# Patient Record
Sex: Female | Born: 1990
Health system: Southern US, Community
[De-identification: ages and names within clinical notes are randomized; demographics above are authoritative.]

---
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
## 2011-12-08 ENCOUNTER — Ambulatory Visit (INDEPENDENT_AMBULATORY_CARE_PROVIDER_SITE_OTHER): Payer: Self-pay | Admitting: Family Medicine

## 2011-12-08 ENCOUNTER — Encounter: Payer: Self-pay | Admitting: Family Medicine

## 2011-12-08 VITALS — BP 114/76 | HR 71 | Temp 97.7°F | Ht 66.0 in | Wt 141.0 lb

## 2011-12-08 DIAGNOSIS — R109 Unspecified abdominal pain: Secondary | ICD-10-CM

## 2011-12-08 LAB — COMPREHENSIVE METABOLIC PANEL
CO2: 27 mEq/L (ref 19–32)
Calcium: 9.9 mg/dL (ref 8.4–10.5)
Creat: 0.7 mg/dL (ref 0.50–1.10)
Glucose, Bld: 66 mg/dL — ABNORMAL LOW (ref 70–99)
Sodium: 137 mEq/L (ref 135–145)
Total Bilirubin: 0.3 mg/dL (ref 0.3–1.2)
Total Protein: 7.6 g/dL (ref 6.0–8.3)

## 2011-12-08 LAB — POCT URINALYSIS DIPSTICK
Ketones, UA: NEGATIVE
Protein, UA: NEGATIVE
Spec Grav, UA: 1.025
pH, UA: 5.5

## 2011-12-08 LAB — LIPASE: Lipase: 17 U/L (ref 0–75)

## 2011-12-08 NOTE — Patient Instructions (Addendum)
Hannah Lopez,  Thank you for coming in today. Please keep in mind the things we discussed, eating healthy to prevent stomach upset and pain.  I will call or send a letter with your blood work results.  I would like to check a fasting cholesterol in the future.   Dr. Armen Pickup

## 2011-12-10 ENCOUNTER — Encounter: Payer: Self-pay | Admitting: Family Medicine

## 2011-12-10 NOTE — Assessment & Plan Note (Addendum)
A: intermittent abdominal pain associated with eating certain foods. Normal exam today. Biliary dysfunction is a possibility but the pain is not in the normal location for biliary colic. May also be gas pain.  P: -check labs: CMP, lipase, UA -advised avoidance of provocative foods.

## 2011-12-10 NOTE — Progress Notes (Signed)
Subjective:     Patient ID: Marabella Popiel, female   DOB: 09/11/1990, 21 y.o.   MRN: [id number]  HPI 21 yo F presents to establish primary care and discuss the following: 1. LLQ pain: non-radiating, achy, no pain currently, associated with eating fatty foods and dizziness. No fever,  N/V/D, blood in stool. No sexual activity.   No past medical history on file. No past surgical history on file. Family History  Problem Relation Age of Onset  . Diabetes Mother   . Hyperlipidemia Mother   . Diabetes Father    History   Social History  . Marital Status: Single    Spouse Name: N/A    Number of Children: N/A  . Years of Education: 12    Occupational History  . Student      [employer]   Social History Main Topics  . Smoking status: Current Some Day Smoker  . Smokeless tobacco: Not on file  . Alcohol Use: 6.6 oz/week    2 Cans of beer, 9 Shots of liquor per week  . Drug Use: No  . Sexually Active: No   Other Topics Concern  . Not on file   Social History Narrative   Lives at home with mom, dad and 4 younger brothers.    Review of Systems Negative except as per HPI.     Objective:   Physical Exam BP 114/76  Pulse 71  Temp 97.7 F (36.5 C) (Oral)  Ht 5\' 6"  (1.676 m)  Wt 141 lb (63.957 kg)  BMI 22.76 kg/m2  LMP 11/12/2011  General Appearance:    Alert, cooperative, no distress, appears stated age  Head:    Normocephalic, without obvious abnormality, atraumatic  Eyes:    PERRL, conjunctiva/corneas clear, EOM's intact, fundi    benign, both eyes  Ears:    Normal TM's and external ear canals, both ears  Nose:   Nares normal, septum midline, mucosa normal, no drainage    or sinus tenderness  Throat:   Lips, mucosa, and tongue normal; teeth and gums normal  Neck:   Supple, symmetrical, trachea midline, no adenopathy;    thyroid:  no enlargement/tenderness/nodules; no carotid   bruit or JVD  Back:     Symmetric, no curvature, ROM normal, no CVA tenderness    Lungs:     Clear to auscultation bilaterally, respirations unlabored  Chest Wall:    No tenderness or deformity   Heart:    Regular rate and rhythm, S1 and S2 normal, no murmur, rub   or gallop  Abdomen:     Soft, non-tender, bowel sounds active all four quadrants,    no masses, no organomegaly  Extremities:   Extremities normal, atraumatic, no cyanosis or edema  Pulses:   2+ and symmetric all extremities  Skin:   Skin color, texture, turgor normal, no rashes or lesions  Lymph nodes:   Cervical, supraclavicular, and axillary nodes normal  Neurologic:   Grossly normal.    Assessment and Plan:

## 2012-01-11 ENCOUNTER — Ambulatory Visit (INDEPENDENT_AMBULATORY_CARE_PROVIDER_SITE_OTHER): Payer: Self-pay | Admitting: Family Medicine

## 2012-01-11 ENCOUNTER — Encounter: Payer: Self-pay | Admitting: Family Medicine

## 2012-01-11 VITALS — BP 125/84 | HR 72 | Temp 97.9°F | Ht 66.0 in | Wt 139.0 lb

## 2012-01-11 DIAGNOSIS — N6009 Solitary cyst of unspecified breast: Secondary | ICD-10-CM

## 2012-01-11 NOTE — Assessment & Plan Note (Signed)
Reassured patient that this is likely a benign fibrocystic change.  Provided patient education on fibrocystic breast changes, advised to keep an eye on it, f/u if it changes.

## 2012-01-11 NOTE — Patient Instructions (Signed)
Breast Cyst A breast cyst is a sac in your breast that is filled with fluid. This is common in women. Women can have one or many cysts. When the breasts contain many cysts, it is usually due to a noncancerous (benign) condition called fibrocystic change. These lumps form under the influence of female hormones (estrogen and progesterone). The lumps are most often located in the upper, outer portion of the breast. They are often more swollen, painful, and tender before your period starts. They usually disappear after menopause, unless you are on hormone therapy. Different types of cysts:  Macrocysts. These are cysts that are about 2 inches (5.1 cm) in diameter.   Microcysts. These are tiny cysts that you cannot feel, but that are seen with a mammogram or an ultrasound.   Galactocele. This is a cyst containing milk, which develops when and if you suddenly stop breastfeeding.   Sebaceous cyst of the skin (not in the breast tissue itself). These are not cancerous.  Breast cysts do not increase your chance of getting breast cancer. However, they must be followed and treated closely, because a cyst can be cancerous. Be sure to see your caregiver for follow-up care as recommended.  CAUSES   It is not completely known what causes a breast cyst.   Estrogen may influence the development of a breast cyst.   An overgrowth of milk glands and connective tissue in the breast can block the milk glands, causing them to fill with fluid.   Scar tissue in the breast from previous surgery may block the glands, causing a cyst.  SYMPTOMS   Feeling a smooth, round, soft lump (like a grape) in the breast that is easily moveable.   Breast discomfort or pain, especially in the area of the cyst.   Increase in size of the lump before your menstrual period, and decrease in size after your menstrual period.  DIAGNOSIS   The cyst can be felt during an exam by your caregiver.   Mammogram (breast X-ray).    Ultrasound.   Removing fluid from the cyst with a needle (fine needle aspiration).  TREATMENT   Your caregiver may feel there is no reason for treatment. He or she may watch to see if it goes away on its own.   Hormone treatment.   Needle aspiration. There is a 40% chance of the cyst recurring after aspiration.   Surgery to remove the whole cyst.  HOME CARE INSTRUCTIONS   Get a yearly exam by your caregiver.   Practice "breast self-awareness." This means understanding the normal appearance and feel of your breasts and may include breast self-examination.   Have a clinical breast exam (CBE) by a caregiver every 1 to 3 years if you are 79 to 20 years of age. After age 42, you should have a CBE every year.   Get mammogram tests as directed by your caregiver.   Only take over-the-counter pain medicine as directed by your caregiver.   Wear a good support bra, especially when exercising.   Avoid caffeine.   Reduce your salt intake, especially before your menstrual period. Too much salt can cause fluid retention, breast swelling, and discomfort.  SEEK MEDICAL CARE IF:   You feel, or think you feel, a lump in your breast.   You notice that both breasts look different than usual.   You notice that both breasts feel different than before.   Your breast is still causing pain, after your menstrual period is over.  You need medicine for breast pain and swelling that occurs with your menstrual period.  SEEK IMMEDIATE MEDICAL CARE IF:   You develop severe pain, tenderness, redness, or warmth in your breast.   You develop nipple discharge or bleeding.   Your breast lump becomes hard and painful.   You find new lumps or bumps that were not there before.   You feel lumps in your armpit (axilla).   You notice dimpling or wrinkling of the breast or nipple.   You have a fever.  Document Released: 05/18/2005 Document Revised: 05/07/2011 Document Reviewed: 09/07/2008 [facility]  Patient Information 2012 [place], [place].

## 2012-01-11 NOTE — Progress Notes (Signed)
  Subjective:    Patient ID: Hannah Lopez, female    DOB: August 07, 1990, 21 y.o.   MRN: [id number]  HPI  Hannah Lopez comes in complaining of a knot she can feel in her left upper outer breast.  She noticed it a few weeks ago.  She has never noticed one before.  It does not hurt, it has not changed in size.    Family History Negative for Breast cancer.   Review of Systems See HPI    Objective:   Physical Exam BP 125/84  Pulse 72  Temp 97.9 F (36.6 C) (Oral)  Ht 5\' 6"  (1.676 m)  Wt 139 lb (63.05 kg)  BMI 22.44 kg/m2  LMP 12/21/2011 General appearance: alert, cooperative and no distress Breasts: normal appearance, no masses or tenderness, Patient has a <1 cm mobile fibrocystic mass in left upper outer quadrant.  Bilateral nipple piercings.        Assessment & Plan:

## 2012-03-09 ENCOUNTER — Emergency Department (HOSPITAL_COMMUNITY)
Admission: EM | Admit: 2012-03-09 | Discharge: 2012-03-09 | Disposition: A | Discharge status: Home / Self Care | Payer: No Typology Code available for payment source | Attending: Emergency Medicine | Admitting: Emergency Medicine

## 2012-03-09 ENCOUNTER — Encounter (HOSPITAL_COMMUNITY): Payer: Self-pay | Admitting: Emergency Medicine

## 2012-03-09 ENCOUNTER — Emergency Department (HOSPITAL_COMMUNITY): Payer: No Typology Code available for payment source

## 2012-03-09 DIAGNOSIS — Y92410 Unspecified street and highway as the place of occurrence of the external cause: Secondary | ICD-10-CM | POA: Insufficient documentation

## 2012-03-09 DIAGNOSIS — F0781 Postconcussional syndrome: Secondary | ICD-10-CM

## 2012-03-09 DIAGNOSIS — M542 Cervicalgia: Secondary | ICD-10-CM

## 2012-03-09 MED ORDER — NAPROXEN 500 MG PO TABS: 500.0000 mg | ORAL_TABLET | Freq: Two times a day (BID) | ORAL | Status: DC

## 2012-03-09 MED ORDER — HYDROCODONE-ACETAMINOPHEN 5-325 MG PO TABS: 1.0000 | ORAL_TABLET | Freq: Four times a day (QID) | ORAL | Status: DC | PRN

## 2012-03-09 NOTE — ED Notes (Signed)
Patient transported to CT 
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
## 2012-03-09 NOTE — ED Provider Notes (Signed)
Medical screening examination/treatment/procedure(s) were performed by non-physician practitioner and as supervising physician I was immediately available for consultation/collaboration.   Shelda Jakes, MD 03/09/12 1723
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
## 2012-03-10 ENCOUNTER — Encounter: Payer: Self-pay | Admitting: Family Medicine

## 2012-10-05 ENCOUNTER — Ambulatory Visit (INDEPENDENT_AMBULATORY_CARE_PROVIDER_SITE_OTHER): Payer: No Typology Code available for payment source | Admitting: Family Medicine

## 2012-10-05 ENCOUNTER — Encounter: Payer: Self-pay | Admitting: Family Medicine

## 2012-10-05 VITALS — BP 122/79 | HR 68 | Ht 67.0 in | Wt 144.0 lb

## 2012-10-05 DIAGNOSIS — N644 Mastodynia: Secondary | ICD-10-CM

## 2012-10-05 LAB — POCT URINE PREGNANCY: Preg Test, Ur: NEGATIVE

## 2012-10-05 MED ORDER — IBUPROFEN 600 MG PO TABS: 600.0000 mg | ORAL_TABLET | Freq: Three times a day (TID) | ORAL | Status: DC | PRN

## 2012-10-05 NOTE — Assessment & Plan Note (Signed)
U preg negative.  No red flags on exam.  Discussed decreasing caffeine use, as that may worsen fibrocystic disease/changes.  Will have pt keep calendar of cycle and pain as well as caffeine use.  Will also try high dose NSAID to see if this helps with some of the pain. F/u PRN

## 2012-10-05 NOTE — Progress Notes (Signed)
S: Pt comes in today for SDA for tender, swollen breasts.  She has been seen in 12/2011 and diagnosed with a breast cyst/benign fibrocystic change.  Today she reports bilateral breast tenderness for the past 5-6 days.  She has also noticed her breasts feel swollen and hard.  Her L breast looks bigger than her R.  She has not noticed any redness or nipple d/c, and has had no fevers/chills, N/V/D or skin changes.  She does feel some hardness around the nipple.  She reports she is not sexually active and LMP was 2 weeks ago.  She has never had this before and reports that this feels different than in 2013.  She has tried ibuprofen 200mg  1x/day for the past few days but it has not seemed to help.  Of note, she is currently taking weight loss pills, including green coffee bean, garcinia cambogia, and CololClenz for the past 2 months.  She is not interested in OCPs.     ROS: Per HPI  History  Smoking status  . Current Some Day Smoker  Smokeless tobacco  . Not on file    O:  Filed Vitals:   10/05/12 1001  BP: 122/79  Pulse: 68    Gen: NAD Breast: small breasts, bilaterally without any overlying skin changes, no redness or erythema. L breast does appear slightly larger than R. Bilateral nipple piercings. No definitive mass but mild nodularity c/w fibrocystic breasts. No axillary LAD bilaterally.    A/P: 22 y.o. female p/w bilateral breast pain -See problem list -f/u in 1-2 months PRN

## 2012-10-05 NOTE — Patient Instructions (Addendum)
It was nice to meet you today.  I think your breast pain is from fibrocystic changes. I am prescribing you some high strength antiinflammatories that may help.  Take it 3 times per day for the next few days to week to see if it helps. I want you to stop the diet pills-- or at least stop the green coffee bean-- caffeine tends to make this kind of breast pain worse, and these pills have a lot of caffeine in them.  I want you to keep a calendar of your pain, your periods, and your caffeine intake-- we will see if they are related.  Come back to see your regular doctor in 1-2 months with your calendar.

## 2012-11-22 ENCOUNTER — Ambulatory Visit (INDEPENDENT_AMBULATORY_CARE_PROVIDER_SITE_OTHER): Payer: No Typology Code available for payment source | Admitting: Family Medicine

## 2012-11-22 VITALS — BP 105/70 | HR 64 | Temp 98.3°F | Ht 67.0 in | Wt 146.0 lb

## 2012-11-22 DIAGNOSIS — H579 Unspecified disorder of eye and adnexa: Secondary | ICD-10-CM

## 2012-11-22 DIAGNOSIS — H5789 Other specified disorders of eye and adnexa: Secondary | ICD-10-CM

## 2012-11-22 DIAGNOSIS — H109 Unspecified conjunctivitis: Secondary | ICD-10-CM

## 2012-11-22 MED ORDER — TOBRAMYCIN 0.3 % OP SOLN: 1.0000 [drp] | OPHTHALMIC | Status: DC

## 2012-11-22 NOTE — Patient Instructions (Addendum)
Return if not improved in a few days or if you develop a rash on your face.   Conjunctivitis Conjunctivitis is commonly called "pink eye." Conjunctivitis can be caused by bacterial or viral infection, allergies, or injuries. There is usually redness of the lining of the eye, itching, discomfort, and sometimes discharge. There may be deposits of matter along the eyelids. A viral infection usually causes a watery discharge, while a bacterial infection causes a yellowish, thick discharge. Pink eye is very contagious and spreads by direct contact. You may be given antibiotic eyedrops as part of your treatment. Before using your eye medicine, remove all drainage from the eye by washing gently with warm water and cotton balls. Continue to use the medication until you have awakened 2 mornings in a row without discharge from the eye. Do not rub your eye. This increases the irritation and helps spread infection. Use separate towels from other household members. Wash your hands with soap and water before and after touching your eyes. Use cold compresses to reduce pain and sunglasses to relieve irritation from light. Do not wear contact lenses or wear eye makeup until the infection is gone. SEEK MEDICAL CARE IF:   Your symptoms are not better after 3 days of treatment.  You have increased pain or trouble seeing.  The outer eyelids become very red or swollen. Document Released: 06/25/2004 Document Revised: 08/10/2011 Document Reviewed: 05/18/2005 [facility] Patient Information 2014 [place], [place].

## 2012-11-23 ENCOUNTER — Other Ambulatory Visit: Payer: Self-pay | Admitting: *Deleted

## 2012-11-23 NOTE — Progress Notes (Signed)
  Subjective:    Patient ID: Hannah Lopez, female    DOB: Dec 29, 1990, 22 y.o.   MRN: [id number]  HPI Her friend had pink eye last week. She also got pool water in her eyes. Stopped wearing her contacts due to left eye irritation that developed a couple days ago. She feels tender under her eye and lateral to it, but hasn't felt any bumps. She denies URI or sinus symptoms.  Recently became sexually active and had BCP prescribed. She hasn't had vaginal discharge, but has had mild vaginal discomfort with intercourse. No joint pains.   Review of Systems     Objective:   Physical Exam  Constitutional: She appears well-developed and well-nourished.  HENT:  Head: Normocephalic.  Right Ear: External ear normal.  Left Ear: External ear normal.  Nose: Nose normal.  Mouth/Throat: Oropharynx is clear and moist. No oropharyngeal exudate.  Eyes: EOM are normal. Pupils are equal, round, and reactive to light. Right eye exhibits no discharge. Left eye exhibits discharge.  left bulbar conjunctival redness without photophobia.No purulence apparent.   Normal anterior chamber and fundus  Wearing glasses   Fluroescein staining did not reveal cornea injury  Cardiovascular: Normal rate and regular rhythm.   Pulmonary/Chest: Effort normal and breath sounds normal. She has no rales.  Lymphadenopathy:    She has no cervical adenopathy.  Neurological: She is alert.  Psychiatric: She has a normal mood and affect.          Assessment & Plan:

## 2012-11-23 NOTE — Assessment & Plan Note (Addendum)
Likely viral in left eye, but purulent drainage this AM and unilateral so will treat with antibiotic. Advised to use alcohol gel after every time she touches her face.   Vision worse on right eye likely due to wearing glasses that are several years old.  No rash to suggest early trigeminal herpes zoster

## 2012-11-25 ENCOUNTER — Other Ambulatory Visit: Payer: Self-pay | Admitting: Family Medicine

## 2012-11-25 MED ORDER — NAPROXEN 500 MG PO TABS: 500.0000 mg | ORAL_TABLET | Freq: Two times a day (BID) | ORAL | Status: DC

## 2012-11-25 NOTE — Telephone Encounter (Signed)
Received refill request for naproxen and vicodin, presumably for breast pain. Refilled naproxen. Denied vicodin refill, no appropriate w/o office visit.

## 2012-12-19 ENCOUNTER — Encounter: Payer: Self-pay | Admitting: Family Medicine

## 2012-12-19 ENCOUNTER — Ambulatory Visit (INDEPENDENT_AMBULATORY_CARE_PROVIDER_SITE_OTHER): Payer: No Typology Code available for payment source | Admitting: Family Medicine

## 2012-12-19 VITALS — BP 114/75 | HR 85 | Temp 97.9°F | Wt 146.0 lb

## 2012-12-19 DIAGNOSIS — N926 Irregular menstruation, unspecified: Secondary | ICD-10-CM

## 2012-12-19 DIAGNOSIS — N939 Abnormal uterine and vaginal bleeding, unspecified: Secondary | ICD-10-CM

## 2012-12-19 MED ORDER — NORGESTIMATE-ETH ESTRADIOL 0.25-35 MG-MCG PO TABS: 1.0000 | ORAL_TABLET | Freq: Every day | ORAL | Status: DC

## 2012-12-19 MED ORDER — NORGESTIMATE-ETH ESTRADIOL 0.25-35 MG-MCG PO TABS: ORAL_TABLET | ORAL | Status: DC

## 2012-12-19 NOTE — Assessment & Plan Note (Addendum)
Upreg negative. Patient likely has abnormal bleeding from missing OCP and some hormonal imbalance. Will give combine OCP to take 3 tabs daily for one week to stop the bleeding, then resume one tab daily for contraception. RTC if anything changes, or if she fails to stop bleeding. Patient will need pap smear at age 22. Can take NSAID as needed for pain.

## 2012-12-19 NOTE — Progress Notes (Signed)
Patient ID: Hannah Lopez, female   DOB: 13-Sep-1990, 22 y.o.   MRN: [id number]  [facility] [name] M. Vonnie Spagnolo, MD Phone: [phone]   Subjective: HPI: Patient is a 22 y.o. female presenting to clinic today for abnormal bleeding.  Started bleeding on July 5, bleed every day since then. States it is a normal period. Passing some clots. No pain or cramping. [facility] but [facility] 2 separate [facility]. She missed a pill on July 4, and started bleeding the follow day. She restarted the pill and continue to bleed. She has never had anything like this before. Prior to starting period she had regular period, not super heavy or painful. Currently sexually active. No nausea, no vomiting. No known exposure to STD.   History Reviewed: Some day smoker. Health Maintenance: UTD  ROS: Please see HPI above.  Objective: Office vital signs reviewed. BP 114/75  Pulse 85  Temp(Src) 97.9 F (36.6 C) (Oral)  Wt 146 lb (66.225 kg)  BMI 22.86 kg/m2  LMP 12/03/2012  Physical Examination:  General: Awake, alert. NAD. HEENT: Atraumatic, normocephalic. MMM Pulm: CTAB, no wheezes Cardio: RRR, no murmurs appreciated Abdomen:+BS, soft, nontender, nondistended Neuro: Grossly intact  Assessment: 22 y.o. female with abnormal uterine bleeding  Plan: See Problem List and After Visit Summary

## 2012-12-19 NOTE — Patient Instructions (Signed)
Abnormal Uterine Bleeding Abnormal uterine bleeding can have many causes. Some cases are simply treated, while others are more serious. There are several kinds of bleeding that is considered abnormal, including:  Bleeding between periods.  Bleeding after sexual intercourse.  Spotting anytime in the menstrual cycle.  Bleeding heavier or more than normal.  Bleeding after menopause. CAUSES  There are many causes of abnormal uterine bleeding. It can be present in teenagers, pregnant women, women during their reproductive years, and women who have reached menopause. Your caregiver will look for the more common causes depending on your age, signs, symptoms and your particular circumstance. Most cases are not serious and can be treated. Even the more serious causes, like cancer of the female organs, can be treated adequately if found in the early stages. That is why all types of bleeding should be evaluated and treated as soon as possible. DIAGNOSIS  Diagnosing the cause may take several kinds of tests. Your caregiver may:  Take a complete history of the type of bleeding.  Perform a complete physical exam and Pap smear.  Take an ultrasound on the abdomen showing a picture of the female organs and the pelvis.  Inject dye into the uterus and Fallopian tubes and X-ray them (hysterosalpingogram).  Place fluid in the uterus and do an ultrasound (sonohysterogrqphy).  Take a CT scan to examine the female organs and pelvis.  Take an MRI to examine the female organs and pelvis. There is no X-ray involved with this procedure.  Look inside the uterus with a telescope that has a light at the end (hysteroscopy).  Scrap the inside of the uterus to get tissue to examine (Dilatation and Curettage, D&C).  Look into the pelvis with a telescope that has a light at the end (laparoscopy). This is done through a very small cut (incision) in the abdomen. TREATMENT  Treatment will depend on the cause of the  abnormal bleeding. It can include:  Doing nothing to allow the problem to take care of itself over time.  Hormone treatment.  Birth control pills.  Treating the medical condition causing the problem.  Laparoscopy.  Major or minor surgery  Destroying the lining of the uterus with electrical currant, laser, freezing or heat (uterine ablation). HOME CARE INSTRUCTIONS   Follow your caregiver's recommendation on how to treat your problem.  See your caregiver if you missed a menstrual period and think you may be pregnant.  If you are bleeding heavily, count the number of pads/tampons you use and how often you have to change them. Tell this to your caregiver.  Avoid sexual intercourse until the problem is controlled. SEEK MEDICAL CARE IF:   You have any kind of abnormal bleeding mentioned above.  You feel dizzy at times.  You are 22 years old and have not had a menstrual period yet. SEEK IMMEDIATE MEDICAL CARE IF:   You pass out.  You are changing pads/tampons every 15 to 30 minutes.  You have belly (abdominal) pain.  You have a temperature of 100 F (37.8 C) or higher.  You become sweaty or weak.  You are passing large blood clots from the vagina.  You start to feel sick to your stomach (nauseous) and throw up (vomit). Document Released: 05/18/2005 Document Revised: 08/10/2011 Document Reviewed: 10/11/2008 [facility] Patient Information 2014 [place], [place].  Pick up sprintec pills from [facility] on [address]. Take 3 pills per day for one week. This will stop your bleeding.

## 2014-06-01 HISTORY — PX: BREAST ENHANCEMENT SURGERY: SHX7

## 2014-08-20 ENCOUNTER — Ambulatory Visit
Admission: RE | Admit: 2014-08-20 | Discharge: 2014-08-20 | Disposition: A | Discharge status: Home / Self Care | Payer: BLUE CROSS/BLUE SHIELD | Source: Ambulatory Visit | Attending: Family Medicine | Admitting: Family Medicine

## 2014-08-20 ENCOUNTER — Other Ambulatory Visit: Payer: Self-pay | Admitting: Family Medicine

## 2014-08-20 DIAGNOSIS — R200 Anesthesia of skin: Secondary | ICD-10-CM

## 2014-11-06 ENCOUNTER — Other Ambulatory Visit: Payer: Self-pay | Admitting: Family Medicine

## 2014-11-06 DIAGNOSIS — E049 Nontoxic goiter, unspecified: Secondary | ICD-10-CM

## 2014-11-09 ENCOUNTER — Ambulatory Visit
Admission: RE | Admit: 2014-11-09 | Discharge: 2014-11-09 | Disposition: A | Discharge status: Home / Self Care | Payer: BLUE CROSS/BLUE SHIELD | Source: Ambulatory Visit | Attending: Family Medicine | Admitting: Family Medicine

## 2014-11-09 DIAGNOSIS — E049 Nontoxic goiter, unspecified: Secondary | ICD-10-CM

## 2016-01-23 ENCOUNTER — Other Ambulatory Visit: Payer: Self-pay | Admitting: Physician Assistant

## 2016-01-23 ENCOUNTER — Other Ambulatory Visit (HOSPITAL_COMMUNITY)
Admission: RE | Admit: 2016-01-23 | Discharge: 2016-01-23 | Disposition: A | Discharge status: Home / Self Care | Payer: BLUE CROSS/BLUE SHIELD | Source: Ambulatory Visit | Attending: Physician Assistant | Admitting: Physician Assistant

## 2016-01-23 DIAGNOSIS — N760 Acute vaginitis: Secondary | ICD-10-CM | POA: Insufficient documentation

## 2016-01-23 DIAGNOSIS — Z113 Encounter for screening for infections with a predominantly sexual mode of transmission: Secondary | ICD-10-CM | POA: Insufficient documentation

## 2016-01-23 DIAGNOSIS — Z01419 Encounter for gynecological examination (general) (routine) without abnormal findings: Secondary | ICD-10-CM | POA: Insufficient documentation

## 2016-01-23 DIAGNOSIS — Z23 Encounter for immunization: Secondary | ICD-10-CM | POA: Diagnosis not present

## 2016-01-23 DIAGNOSIS — Z0000 Encounter for general adult medical examination without abnormal findings: Secondary | ICD-10-CM | POA: Diagnosis not present

## 2016-01-23 DIAGNOSIS — Z7721 Contact with and (suspected) exposure to potentially hazardous body fluids: Secondary | ICD-10-CM | POA: Diagnosis not present

## 2016-01-28 LAB — CYTOLOGY - PAP

## 2016-02-19 DIAGNOSIS — N898 Other specified noninflammatory disorders of vagina: Secondary | ICD-10-CM | POA: Diagnosis not present

## 2016-03-01 IMAGING — US US SOFT TISSUE HEAD/NECK
1 series · 14 of 25 positions shown · non-contrast
Comparison: None.

CLINICAL DATA: Goiter by physical exam

EXAM:
THYROID ULTRASOUND
TECHNIQUE: Ultrasound examination of the thyroid gland and adjacent soft
tissues was performed.

[Series 1: us soft tissue head/neck · 0.07mm/px · 14 of 37 slices shown]
[im 1/37]
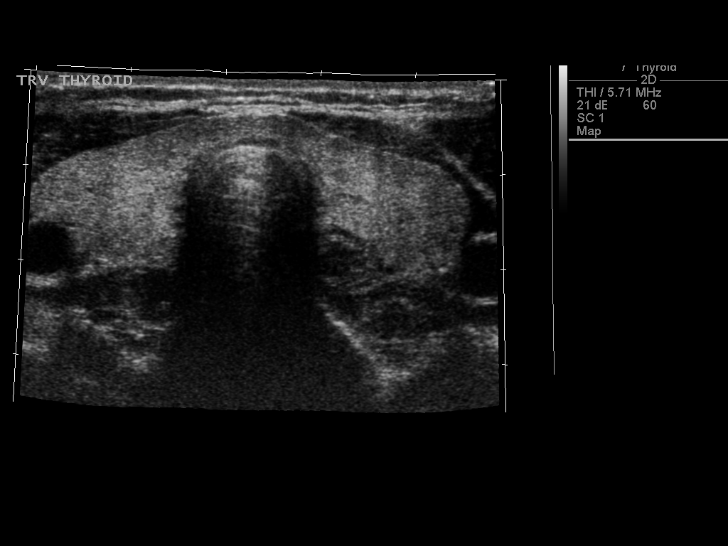
[im 4/37]
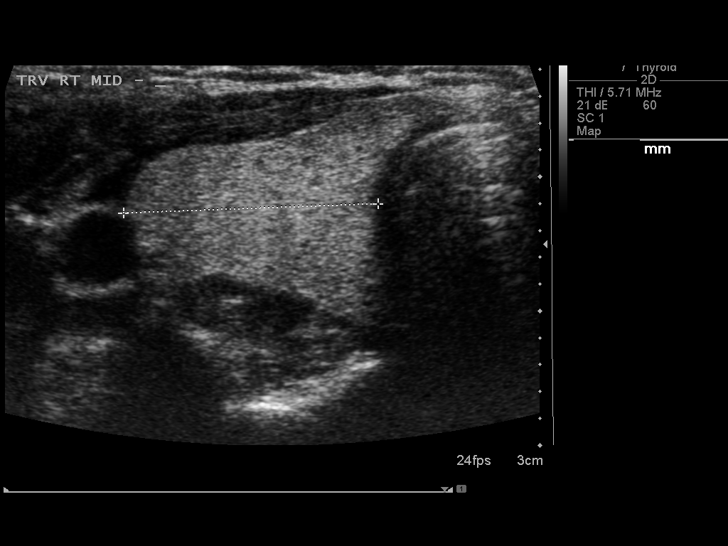
[im 7/37]
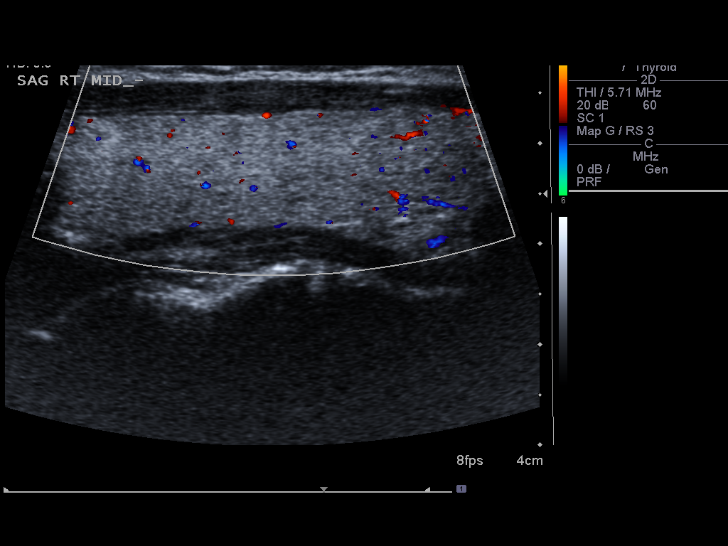
[im 10/37]
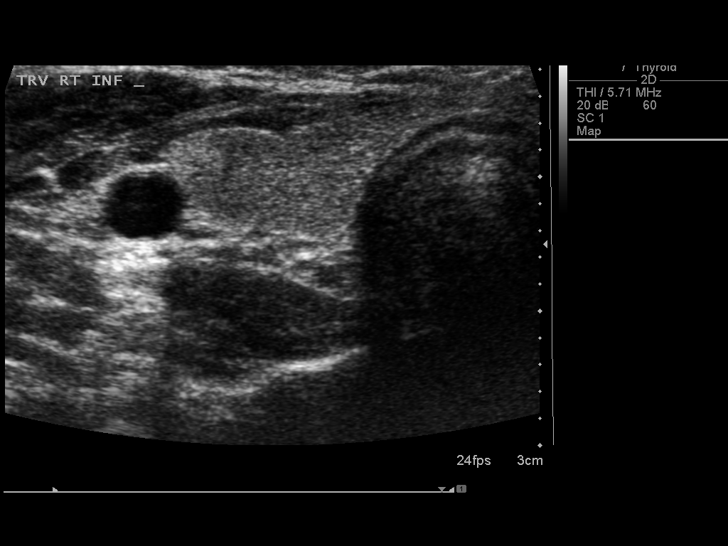
[im 13/37]
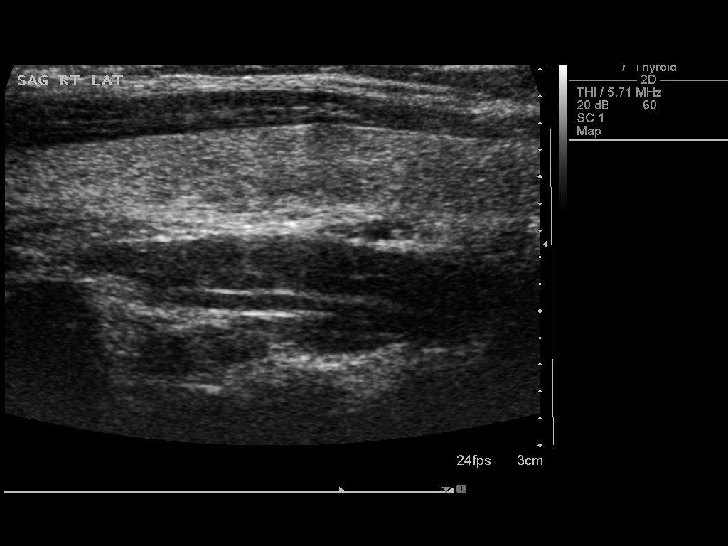
[im 14/37]
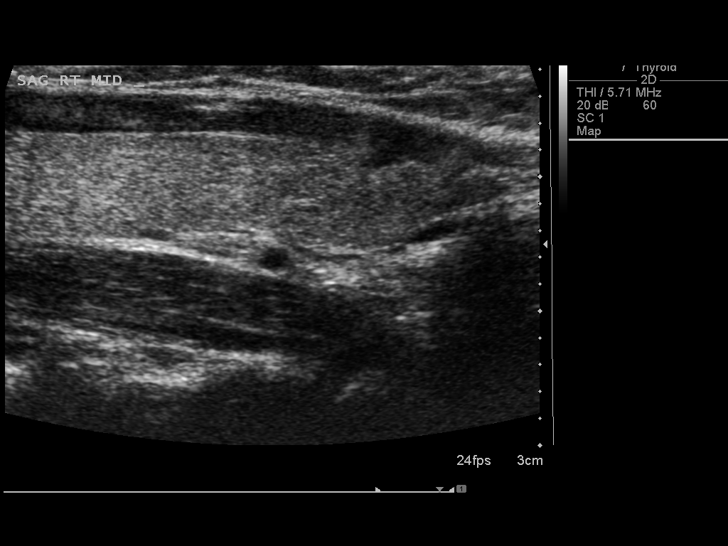
[im 17/37]
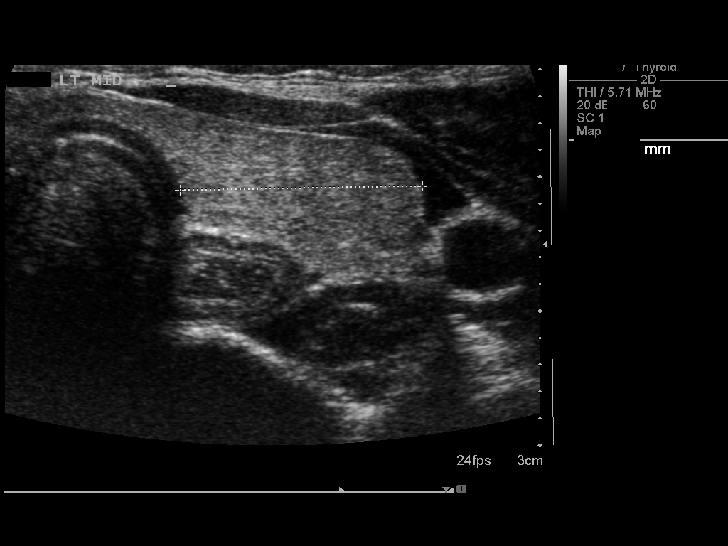
[im 20/37]
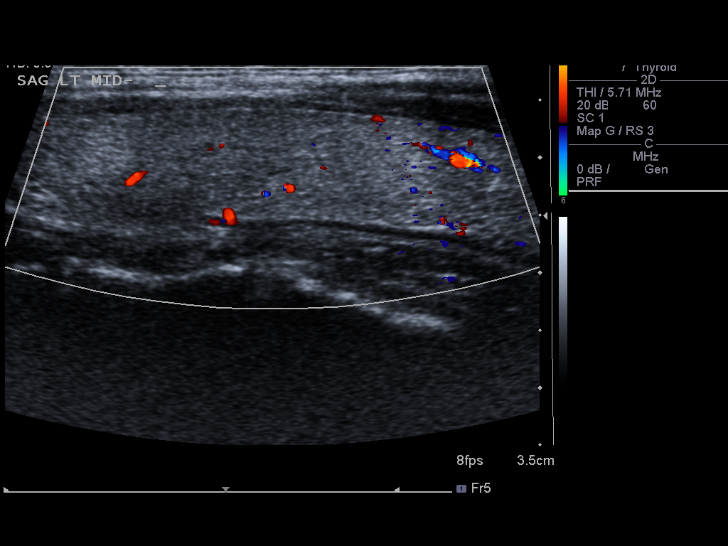
[im 23/37]
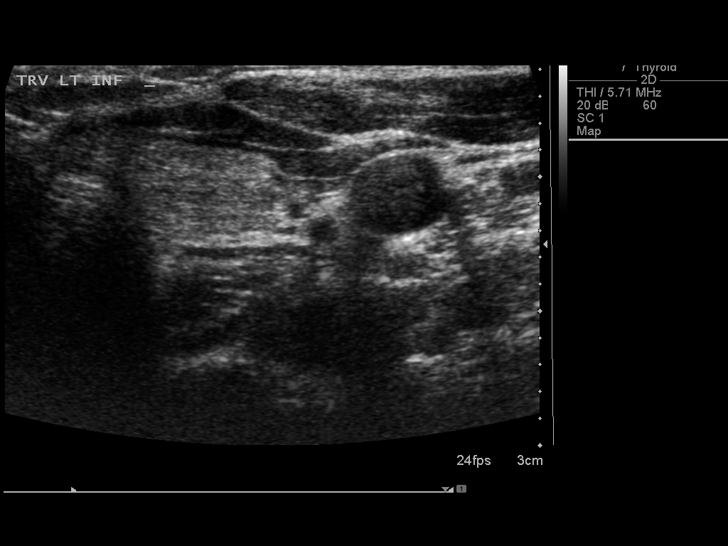
[im 25/37]
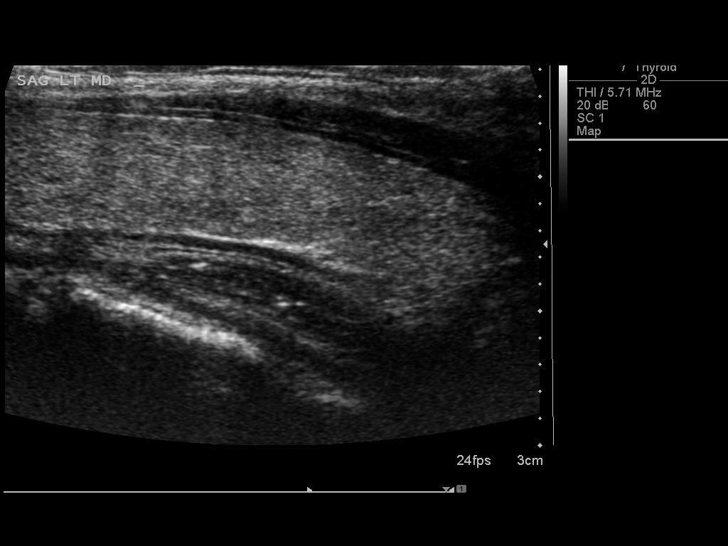
[im 28/37]
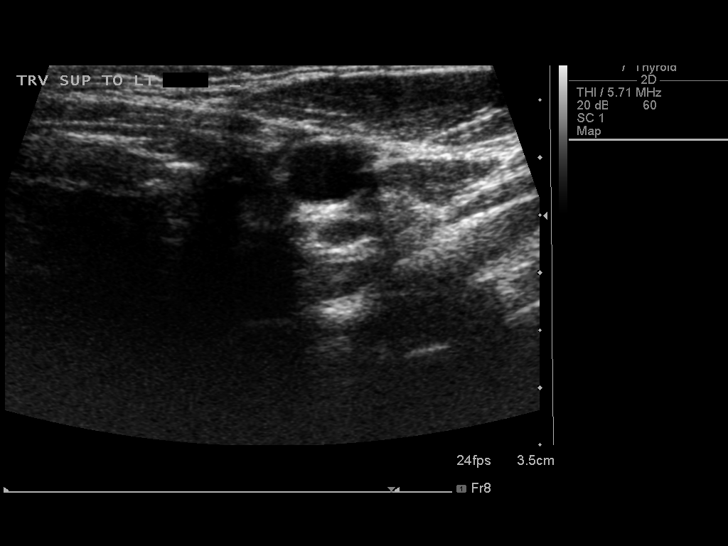
[im 31/37]
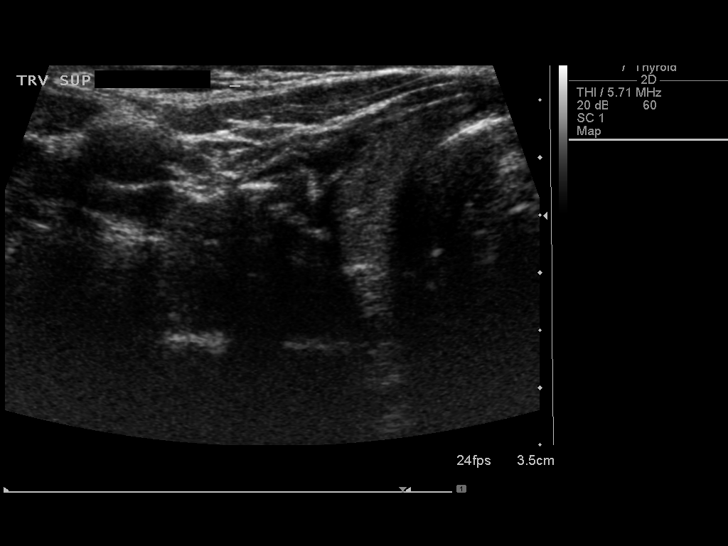
[im 34/37]
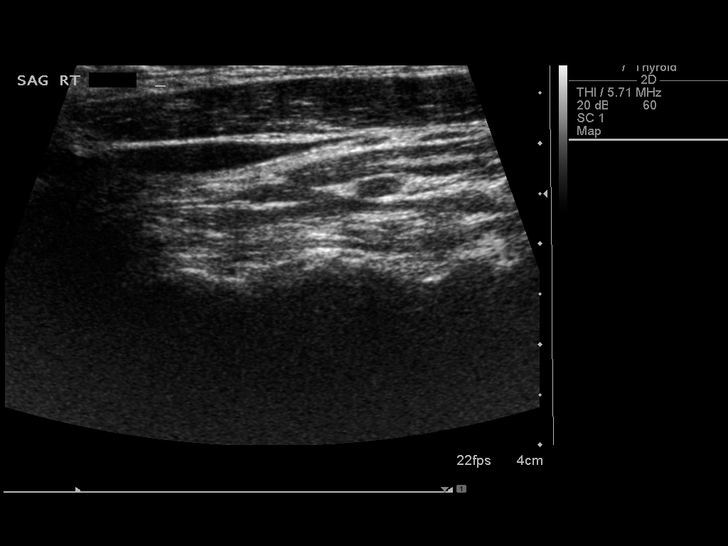
[im 37/37]
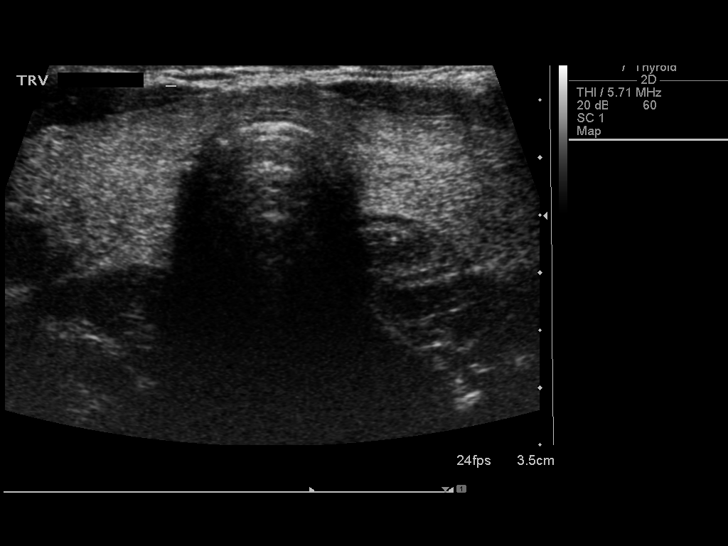

[14 of 25 positions shown; findings below may reference images not displayed]

FINDINGS: Right thyroid lobe

Measurements: 6.2 x 1.1 x 1.9 cm. Heterogeneous thyroid tissue. Tiny
hypoechoic nodule measures 3 mm in the lower pole.

Left thyroid lobe

Measurements: 6.0 x 1.3 x 1.8 cm. Homogeneous tissue without focal
nodule.

Isthmus

Thickness: 3 mm.  No nodules visualized.

Lymphadenopathy

None visualized.
IMPRESSION: The gland is homogeneous and mildly enlarged. There is a solitary
tiny 3 mm right lower pole nodule. Findings do not meet current SRU
consensus criteria for biopsy. Follow-up by clinical exam is
recommended. If patient has known risk factors for thyroid
carcinoma, consider follow-up ultrasound in 12 months. If patient is
clinically hyperthyroid, consider nuclear medicine thyroid uptake
and scan.Reference: Management of Thyroid Nodules Detected at US:
Society of Radiologists in Ultrasound Consensus Conference

## 2016-09-16 DIAGNOSIS — L700 Acne vulgaris: Secondary | ICD-10-CM | POA: Diagnosis not present

## 2016-09-28 DIAGNOSIS — M542 Cervicalgia: Secondary | ICD-10-CM | POA: Diagnosis not present

## 2016-09-28 DIAGNOSIS — M40202 Unspecified kyphosis, cervical region: Secondary | ICD-10-CM | POA: Diagnosis not present

## 2016-09-28 DIAGNOSIS — M4144 Neuromuscular scoliosis, thoracic region: Secondary | ICD-10-CM | POA: Diagnosis not present

## 2016-09-28 DIAGNOSIS — G580 Intercostal neuropathy: Secondary | ICD-10-CM | POA: Diagnosis not present

## 2016-10-01 DIAGNOSIS — M542 Cervicalgia: Secondary | ICD-10-CM | POA: Diagnosis not present

## 2016-10-01 DIAGNOSIS — M4144 Neuromuscular scoliosis, thoracic region: Secondary | ICD-10-CM | POA: Diagnosis not present

## 2016-10-01 DIAGNOSIS — M40202 Unspecified kyphosis, cervical region: Secondary | ICD-10-CM | POA: Diagnosis not present

## 2016-10-01 DIAGNOSIS — G580 Intercostal neuropathy: Secondary | ICD-10-CM | POA: Diagnosis not present

## 2016-10-07 DIAGNOSIS — M4144 Neuromuscular scoliosis, thoracic region: Secondary | ICD-10-CM | POA: Diagnosis not present

## 2016-10-07 DIAGNOSIS — M542 Cervicalgia: Secondary | ICD-10-CM | POA: Diagnosis not present

## 2016-10-07 DIAGNOSIS — M40202 Unspecified kyphosis, cervical region: Secondary | ICD-10-CM | POA: Diagnosis not present

## 2016-10-07 DIAGNOSIS — G580 Intercostal neuropathy: Secondary | ICD-10-CM | POA: Diagnosis not present

## 2016-10-08 DIAGNOSIS — M4144 Neuromuscular scoliosis, thoracic region: Secondary | ICD-10-CM | POA: Diagnosis not present

## 2016-10-08 DIAGNOSIS — G580 Intercostal neuropathy: Secondary | ICD-10-CM | POA: Diagnosis not present

## 2016-10-08 DIAGNOSIS — M40202 Unspecified kyphosis, cervical region: Secondary | ICD-10-CM | POA: Diagnosis not present

## 2016-10-08 DIAGNOSIS — M542 Cervicalgia: Secondary | ICD-10-CM | POA: Diagnosis not present

## 2016-10-13 DIAGNOSIS — G5622 Lesion of ulnar nerve, left upper limb: Secondary | ICD-10-CM | POA: Diagnosis not present

## 2016-10-14 DIAGNOSIS — M40202 Unspecified kyphosis, cervical region: Secondary | ICD-10-CM | POA: Diagnosis not present

## 2016-10-14 DIAGNOSIS — M4144 Neuromuscular scoliosis, thoracic region: Secondary | ICD-10-CM | POA: Diagnosis not present

## 2016-10-14 DIAGNOSIS — G580 Intercostal neuropathy: Secondary | ICD-10-CM | POA: Diagnosis not present

## 2016-10-14 DIAGNOSIS — M542 Cervicalgia: Secondary | ICD-10-CM | POA: Diagnosis not present

## 2016-10-16 DIAGNOSIS — M40202 Unspecified kyphosis, cervical region: Secondary | ICD-10-CM | POA: Diagnosis not present

## 2016-10-16 DIAGNOSIS — M542 Cervicalgia: Secondary | ICD-10-CM | POA: Diagnosis not present

## 2016-10-16 DIAGNOSIS — G580 Intercostal neuropathy: Secondary | ICD-10-CM | POA: Diagnosis not present

## 2016-10-16 DIAGNOSIS — M4144 Neuromuscular scoliosis, thoracic region: Secondary | ICD-10-CM | POA: Diagnosis not present

## 2016-10-21 DIAGNOSIS — G580 Intercostal neuropathy: Secondary | ICD-10-CM | POA: Diagnosis not present

## 2016-10-21 DIAGNOSIS — M542 Cervicalgia: Secondary | ICD-10-CM | POA: Diagnosis not present

## 2016-10-21 DIAGNOSIS — M40202 Unspecified kyphosis, cervical region: Secondary | ICD-10-CM | POA: Diagnosis not present

## 2016-10-21 DIAGNOSIS — M4144 Neuromuscular scoliosis, thoracic region: Secondary | ICD-10-CM | POA: Diagnosis not present

## 2016-10-29 DIAGNOSIS — M542 Cervicalgia: Secondary | ICD-10-CM | POA: Diagnosis not present

## 2016-10-29 DIAGNOSIS — G580 Intercostal neuropathy: Secondary | ICD-10-CM | POA: Diagnosis not present

## 2016-10-29 DIAGNOSIS — M40202 Unspecified kyphosis, cervical region: Secondary | ICD-10-CM | POA: Diagnosis not present

## 2016-10-29 DIAGNOSIS — M4144 Neuromuscular scoliosis, thoracic region: Secondary | ICD-10-CM | POA: Diagnosis not present

## 2016-11-02 DIAGNOSIS — M542 Cervicalgia: Secondary | ICD-10-CM | POA: Diagnosis not present

## 2016-11-02 DIAGNOSIS — M40202 Unspecified kyphosis, cervical region: Secondary | ICD-10-CM | POA: Diagnosis not present

## 2016-11-02 DIAGNOSIS — M4144 Neuromuscular scoliosis, thoracic region: Secondary | ICD-10-CM | POA: Diagnosis not present

## 2016-11-02 DIAGNOSIS — G580 Intercostal neuropathy: Secondary | ICD-10-CM | POA: Diagnosis not present

## 2016-11-05 DIAGNOSIS — G580 Intercostal neuropathy: Secondary | ICD-10-CM | POA: Diagnosis not present

## 2016-11-05 DIAGNOSIS — M542 Cervicalgia: Secondary | ICD-10-CM | POA: Diagnosis not present

## 2016-11-05 DIAGNOSIS — M40202 Unspecified kyphosis, cervical region: Secondary | ICD-10-CM | POA: Diagnosis not present

## 2016-11-05 DIAGNOSIS — M4144 Neuromuscular scoliosis, thoracic region: Secondary | ICD-10-CM | POA: Diagnosis not present

## 2016-11-09 DIAGNOSIS — M4144 Neuromuscular scoliosis, thoracic region: Secondary | ICD-10-CM | POA: Diagnosis not present

## 2016-11-09 DIAGNOSIS — G580 Intercostal neuropathy: Secondary | ICD-10-CM | POA: Diagnosis not present

## 2016-11-09 DIAGNOSIS — M542 Cervicalgia: Secondary | ICD-10-CM | POA: Diagnosis not present

## 2016-11-09 DIAGNOSIS — M40202 Unspecified kyphosis, cervical region: Secondary | ICD-10-CM | POA: Diagnosis not present

## 2016-11-13 DIAGNOSIS — M542 Cervicalgia: Secondary | ICD-10-CM | POA: Diagnosis not present

## 2016-11-13 DIAGNOSIS — M4144 Neuromuscular scoliosis, thoracic region: Secondary | ICD-10-CM | POA: Diagnosis not present

## 2016-11-13 DIAGNOSIS — M40202 Unspecified kyphosis, cervical region: Secondary | ICD-10-CM | POA: Diagnosis not present

## 2016-11-13 DIAGNOSIS — G580 Intercostal neuropathy: Secondary | ICD-10-CM | POA: Diagnosis not present

## 2016-11-18 DIAGNOSIS — G580 Intercostal neuropathy: Secondary | ICD-10-CM | POA: Diagnosis not present

## 2016-11-18 DIAGNOSIS — M40202 Unspecified kyphosis, cervical region: Secondary | ICD-10-CM | POA: Diagnosis not present

## 2016-11-18 DIAGNOSIS — M4144 Neuromuscular scoliosis, thoracic region: Secondary | ICD-10-CM | POA: Diagnosis not present

## 2016-11-18 DIAGNOSIS — M542 Cervicalgia: Secondary | ICD-10-CM | POA: Diagnosis not present

## 2016-11-19 ENCOUNTER — Ambulatory Visit (INDEPENDENT_AMBULATORY_CARE_PROVIDER_SITE_OTHER): Payer: BLUE CROSS/BLUE SHIELD | Admitting: Neurology

## 2016-11-19 ENCOUNTER — Encounter: Payer: Self-pay | Admitting: Neurology

## 2016-11-19 VITALS — BP 110/70 | HR 64 | Ht 66.0 in | Wt 154.0 lb

## 2016-11-19 DIAGNOSIS — R202 Paresthesia of skin: Secondary | ICD-10-CM | POA: Insufficient documentation
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
## 2016-11-19 NOTE — Patient Instructions (Signed)
   We will check EMG and NCV evaluation to look at the nerve function of the left arm.

## 2016-11-30 DIAGNOSIS — M542 Cervicalgia: Secondary | ICD-10-CM | POA: Diagnosis not present

## 2016-11-30 DIAGNOSIS — G580 Intercostal neuropathy: Secondary | ICD-10-CM | POA: Diagnosis not present

## 2016-11-30 DIAGNOSIS — M4144 Neuromuscular scoliosis, thoracic region: Secondary | ICD-10-CM | POA: Diagnosis not present

## 2016-11-30 DIAGNOSIS — M40202 Unspecified kyphosis, cervical region: Secondary | ICD-10-CM | POA: Diagnosis not present

## 2016-12-09 ENCOUNTER — Encounter: Payer: Self-pay | Admitting: Neurology

## 2016-12-09 ENCOUNTER — Ambulatory Visit (INDEPENDENT_AMBULATORY_CARE_PROVIDER_SITE_OTHER): Payer: Self-pay | Admitting: Neurology

## 2016-12-09 ENCOUNTER — Ambulatory Visit (INDEPENDENT_AMBULATORY_CARE_PROVIDER_SITE_OTHER): Payer: BLUE CROSS/BLUE SHIELD | Admitting: Neurology

## 2016-12-09 DIAGNOSIS — M542 Cervicalgia: Secondary | ICD-10-CM | POA: Diagnosis not present

## 2016-12-09 DIAGNOSIS — M4144 Neuromuscular scoliosis, thoracic region: Secondary | ICD-10-CM | POA: Diagnosis not present

## 2016-12-09 DIAGNOSIS — R202 Paresthesia of skin: Secondary | ICD-10-CM

## 2016-12-09 DIAGNOSIS — G580 Intercostal neuropathy: Secondary | ICD-10-CM | POA: Diagnosis not present

## 2016-12-09 DIAGNOSIS — M40202 Unspecified kyphosis, cervical region: Secondary | ICD-10-CM | POA: Diagnosis not present

## 2016-12-09 MED ORDER — GABAPENTIN 100 MG PO CAPS: 100.0000 mg | ORAL_CAPSULE | Freq: Three times a day (TID) | ORAL | 1 refills | Status: DC

## 2016-12-09 NOTE — Procedures (Signed)
     HISTORY:  Hannah PigeonLeena Lopez is a 26 year old patient with a two-year history of discomfort in the left shoulder and paresthesias involving the ulnar aspect of the left hand. The patient is being evaluated for a possible neuropathy or a cervical radiculopathy.  NERVE CONDUCTION STUDIES:  Nerve conduction studies were performed on both upper extremities. The distal motor latencies and motor amplitudes for the median and ulnar nerves were within normal limits. The nerve conduction velocities for these nerves were also normal. The sensory latencies for the median and ulnar nerves were normal. The F wave latencies for the ulnar nerves were normal bilaterally.   EMG STUDIES:  EMG study was performed on the left upper extremity:  The first dorsal interosseous muscle reveals 2 to 4 K units with full recruitment. No fibrillations or positive waves were noted. The abductor pollicis brevis muscle reveals 2 to 4 K units with full recruitment. No fibrillations or positive waves were noted. The extensor indicis proprius muscle reveals 1 to 3 K units with full recruitment. No fibrillations or positive waves were noted. The pronator teres muscle reveals 2 to 3 K units with full recruitment. No fibrillations or positive waves were noted. The biceps muscle reveals 1 to 2 K units with full recruitment. No fibrillations or positive waves were noted. The triceps muscle reveals 2 to 4 K units with full recruitment. No fibrillations or positive waves were noted. The anterior deltoid muscle reveals 2 to 3 K units with full recruitment. No fibrillations or positive waves were noted. The cervical paraspinal muscles were tested at 2 levels. No abnormalities of insertional activity were seen at either level tested. There was poor relaxation.   IMPRESSION:  Nerve conduction studies done on both upper extremities were within normal limits. No evidence of a neuropathy is seen. EMG evaluation of the left upper  extremity is normal, no evidence of an overlying cervical radiculopathy is seen.  Marlan Palau. Keith Tranae Laramie MD 12/09/2016 2:28 PM  [facility] [address] [place], [place] [id number]  Phone [phone] Fax [phone]

## 2016-12-09 NOTE — Progress Notes (Signed)
This patient comes in for EMG nerve conduction studies today. The nerve conduction studies were normal, no evidence of an ulnar neuropathy or carpal tunnel syndrome. EMG of the left arm was normal.  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 She is seeing a chiropractor for her left shoulder discomfort.  The patient will be given gabapentin to take 100 mg 3 times daily for the neuromuscular discomfort, she will call for dose adjustments.

## 2016-12-09 NOTE — Progress Notes (Signed)
Please refer to EMG and nerve conduction study procedure note. 

## 2016-12-16 DIAGNOSIS — M542 Cervicalgia: Secondary | ICD-10-CM | POA: Diagnosis not present

## 2016-12-16 DIAGNOSIS — M4144 Neuromuscular scoliosis, thoracic region: Secondary | ICD-10-CM | POA: Diagnosis not present

## 2016-12-16 DIAGNOSIS — G580 Intercostal neuropathy: Secondary | ICD-10-CM | POA: Diagnosis not present

## 2016-12-16 DIAGNOSIS — M40202 Unspecified kyphosis, cervical region: Secondary | ICD-10-CM | POA: Diagnosis not present

## 2016-12-24 DIAGNOSIS — M542 Cervicalgia: Secondary | ICD-10-CM | POA: Diagnosis not present

## 2016-12-24 DIAGNOSIS — G580 Intercostal neuropathy: Secondary | ICD-10-CM | POA: Diagnosis not present

## 2016-12-24 DIAGNOSIS — M4144 Neuromuscular scoliosis, thoracic region: Secondary | ICD-10-CM | POA: Diagnosis not present

## 2016-12-24 DIAGNOSIS — M40202 Unspecified kyphosis, cervical region: Secondary | ICD-10-CM | POA: Diagnosis not present

## 2016-12-30 DIAGNOSIS — M4144 Neuromuscular scoliosis, thoracic region: Secondary | ICD-10-CM | POA: Diagnosis not present

## 2016-12-30 DIAGNOSIS — M542 Cervicalgia: Secondary | ICD-10-CM | POA: Diagnosis not present

## 2016-12-30 DIAGNOSIS — G580 Intercostal neuropathy: Secondary | ICD-10-CM | POA: Diagnosis not present

## 2016-12-30 DIAGNOSIS — M40202 Unspecified kyphosis, cervical region: Secondary | ICD-10-CM | POA: Diagnosis not present

## 2017-01-04 DIAGNOSIS — M40202 Unspecified kyphosis, cervical region: Secondary | ICD-10-CM | POA: Diagnosis not present

## 2017-01-04 DIAGNOSIS — M4144 Neuromuscular scoliosis, thoracic region: Secondary | ICD-10-CM | POA: Diagnosis not present

## 2017-01-04 DIAGNOSIS — M542 Cervicalgia: Secondary | ICD-10-CM | POA: Diagnosis not present

## 2017-01-04 DIAGNOSIS — G580 Intercostal neuropathy: Secondary | ICD-10-CM | POA: Diagnosis not present

## 2017-01-11 DIAGNOSIS — G580 Intercostal neuropathy: Secondary | ICD-10-CM | POA: Diagnosis not present

## 2017-01-11 DIAGNOSIS — M40202 Unspecified kyphosis, cervical region: Secondary | ICD-10-CM | POA: Diagnosis not present

## 2017-01-11 DIAGNOSIS — M4144 Neuromuscular scoliosis, thoracic region: Secondary | ICD-10-CM | POA: Diagnosis not present

## 2017-01-11 DIAGNOSIS — M542 Cervicalgia: Secondary | ICD-10-CM | POA: Diagnosis not present

## 2017-01-22 DIAGNOSIS — M542 Cervicalgia: Secondary | ICD-10-CM | POA: Diagnosis not present

## 2017-01-22 DIAGNOSIS — M4144 Neuromuscular scoliosis, thoracic region: Secondary | ICD-10-CM | POA: Diagnosis not present

## 2017-01-22 DIAGNOSIS — G580 Intercostal neuropathy: Secondary | ICD-10-CM | POA: Diagnosis not present

## 2017-01-22 DIAGNOSIS — M40202 Unspecified kyphosis, cervical region: Secondary | ICD-10-CM | POA: Diagnosis not present

## 2017-01-29 DIAGNOSIS — M542 Cervicalgia: Secondary | ICD-10-CM | POA: Diagnosis not present

## 2017-01-29 DIAGNOSIS — G580 Intercostal neuropathy: Secondary | ICD-10-CM | POA: Diagnosis not present

## 2017-01-29 DIAGNOSIS — M40202 Unspecified kyphosis, cervical region: Secondary | ICD-10-CM | POA: Diagnosis not present

## 2017-01-29 DIAGNOSIS — M4144 Neuromuscular scoliosis, thoracic region: Secondary | ICD-10-CM | POA: Diagnosis not present

## 2017-02-04 DIAGNOSIS — M542 Cervicalgia: Secondary | ICD-10-CM | POA: Diagnosis not present

## 2017-02-04 DIAGNOSIS — M40202 Unspecified kyphosis, cervical region: Secondary | ICD-10-CM | POA: Diagnosis not present

## 2017-02-04 DIAGNOSIS — G580 Intercostal neuropathy: Secondary | ICD-10-CM | POA: Diagnosis not present

## 2017-02-04 DIAGNOSIS — M4144 Neuromuscular scoliosis, thoracic region: Secondary | ICD-10-CM | POA: Diagnosis not present

## 2017-02-10 DIAGNOSIS — M542 Cervicalgia: Secondary | ICD-10-CM | POA: Diagnosis not present

## 2017-02-10 DIAGNOSIS — G580 Intercostal neuropathy: Secondary | ICD-10-CM | POA: Diagnosis not present

## 2017-02-10 DIAGNOSIS — M40202 Unspecified kyphosis, cervical region: Secondary | ICD-10-CM | POA: Diagnosis not present

## 2017-02-10 DIAGNOSIS — M4144 Neuromuscular scoliosis, thoracic region: Secondary | ICD-10-CM | POA: Diagnosis not present

## 2017-02-17 DIAGNOSIS — M542 Cervicalgia: Secondary | ICD-10-CM | POA: Diagnosis not present

## 2017-02-17 DIAGNOSIS — M40202 Unspecified kyphosis, cervical region: Secondary | ICD-10-CM | POA: Diagnosis not present

## 2017-02-17 DIAGNOSIS — M4144 Neuromuscular scoliosis, thoracic region: Secondary | ICD-10-CM | POA: Diagnosis not present

## 2017-02-17 DIAGNOSIS — G580 Intercostal neuropathy: Secondary | ICD-10-CM | POA: Diagnosis not present

## 2017-02-24 DIAGNOSIS — M4144 Neuromuscular scoliosis, thoracic region: Secondary | ICD-10-CM | POA: Diagnosis not present

## 2017-02-24 DIAGNOSIS — M40202 Unspecified kyphosis, cervical region: Secondary | ICD-10-CM | POA: Diagnosis not present

## 2017-02-24 DIAGNOSIS — G580 Intercostal neuropathy: Secondary | ICD-10-CM | POA: Diagnosis not present

## 2017-02-24 DIAGNOSIS — M542 Cervicalgia: Secondary | ICD-10-CM | POA: Diagnosis not present

## 2017-03-12 DIAGNOSIS — Z008 Encounter for other general examination: Secondary | ICD-10-CM | POA: Diagnosis not present

## 2017-04-14 DIAGNOSIS — B360 Pityriasis versicolor: Secondary | ICD-10-CM | POA: Diagnosis not present

## 2017-05-04 DIAGNOSIS — Z8639 Personal history of other endocrine, nutritional and metabolic disease: Secondary | ICD-10-CM | POA: Diagnosis not present

## 2017-05-04 DIAGNOSIS — Z23 Encounter for immunization: Secondary | ICD-10-CM | POA: Diagnosis not present

## 2017-05-04 DIAGNOSIS — K625 Hemorrhage of anus and rectum: Secondary | ICD-10-CM | POA: Diagnosis not present

## 2017-07-15 DIAGNOSIS — Z309 Encounter for contraceptive management, unspecified: Secondary | ICD-10-CM | POA: Diagnosis not present

## 2017-09-03 DIAGNOSIS — H5213 Myopia, bilateral: Secondary | ICD-10-CM | POA: Diagnosis not present

## 2017-10-14 DIAGNOSIS — L2082 Flexural eczema: Secondary | ICD-10-CM | POA: Diagnosis not present

## 2017-10-14 DIAGNOSIS — N61 Mastitis without abscess: Secondary | ICD-10-CM | POA: Diagnosis not present

## 2017-12-30 DIAGNOSIS — L719 Rosacea, unspecified: Secondary | ICD-10-CM | POA: Diagnosis not present

## 2018-02-09 DIAGNOSIS — H5213 Myopia, bilateral: Secondary | ICD-10-CM | POA: Diagnosis not present

## 2018-04-13 DIAGNOSIS — L700 Acne vulgaris: Secondary | ICD-10-CM | POA: Diagnosis not present

## 2018-04-13 DIAGNOSIS — L719 Rosacea, unspecified: Secondary | ICD-10-CM | POA: Diagnosis not present

## 2018-06-15 DIAGNOSIS — R05 Cough: Secondary | ICD-10-CM | POA: Diagnosis not present

## 2018-08-02 DIAGNOSIS — F0630 Mood disorder due to known physiological condition, unspecified: Secondary | ICD-10-CM | POA: Diagnosis not present

## 2019-06-12 DIAGNOSIS — E559 Vitamin D deficiency, unspecified: Secondary | ICD-10-CM | POA: Diagnosis not present

## 2019-06-12 DIAGNOSIS — B351 Tinea unguium: Secondary | ICD-10-CM | POA: Diagnosis not present

## 2019-06-12 DIAGNOSIS — D509 Iron deficiency anemia, unspecified: Secondary | ICD-10-CM | POA: Diagnosis not present

## 2019-12-19 ENCOUNTER — Other Ambulatory Visit: Payer: Self-pay

## 2019-12-19 ENCOUNTER — Ambulatory Visit (INDEPENDENT_AMBULATORY_CARE_PROVIDER_SITE_OTHER): Payer: BC Managed Care – PPO | Admitting: Podiatry

## 2019-12-19 ENCOUNTER — Encounter: Payer: Self-pay | Admitting: Podiatry

## 2019-12-19 DIAGNOSIS — L601 Onycholysis: Secondary | ICD-10-CM | POA: Diagnosis not present

## 2019-12-19 DIAGNOSIS — B351 Tinea unguium: Secondary | ICD-10-CM | POA: Diagnosis not present

## 2019-12-19 DIAGNOSIS — L608 Other nail disorders: Secondary | ICD-10-CM | POA: Diagnosis not present
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
## 2020-01-12 ENCOUNTER — Telehealth: Payer: Self-pay | Admitting: Podiatry

## 2020-01-12 NOTE — Telephone Encounter (Signed)
Sorry, for some reason I don't think they were sent to me.   The culture did not show fungus but damage to the nails. She had been on Lamisil previously and didn't help. I would do a urea based cream to the nails. If still concern for fungus we can do the [facility] nail fungus to include the urea.

## 2020-01-12 NOTE — Telephone Encounter (Signed)
I informed pt of Dr. Gabriel Rung review of result. Pt asked if with the cream or topical solution treatment she could leave the polish on and I told her no. Pt states she can not go without polish.

## 2020-01-12 NOTE — Telephone Encounter (Signed)
Pt called to inquire about her culture results.

## 2020-02-07 DIAGNOSIS — F0630 Mood disorder due to known physiological condition, unspecified: Secondary | ICD-10-CM | POA: Diagnosis not present

## 2020-02-07 DIAGNOSIS — N926 Irregular menstruation, unspecified: Secondary | ICD-10-CM | POA: Diagnosis not present
